# Patient Record
Sex: Male | Born: 1995 | Race: White | Hispanic: No | Marital: Single | Smoking: Never smoker
Health system: Southern US, Community
[De-identification: ages and names within clinical notes are randomized; demographics above are authoritative.]

---
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
## 2015-09-03 ENCOUNTER — Encounter (HOSPITAL_COMMUNITY): Payer: Self-pay | Admitting: Emergency Medicine

## 2015-09-03 ENCOUNTER — Emergency Department (HOSPITAL_COMMUNITY)
Admission: EM | Admit: 2015-09-03 | Discharge: 2015-09-03 | Disposition: A | Discharge status: Home / Self Care | Payer: BLUE CROSS/BLUE SHIELD | Attending: Emergency Medicine | Admitting: Emergency Medicine

## 2015-09-03 ENCOUNTER — Emergency Department (HOSPITAL_COMMUNITY): Payer: BLUE CROSS/BLUE SHIELD

## 2015-09-03 DIAGNOSIS — Y9367 Activity, basketball: Secondary | ICD-10-CM | POA: Diagnosis not present

## 2015-09-03 DIAGNOSIS — S93401A Sprain of unspecified ligament of right ankle, initial encounter: Secondary | ICD-10-CM | POA: Diagnosis not present

## 2015-09-03 DIAGNOSIS — Y929 Unspecified place or not applicable: Secondary | ICD-10-CM | POA: Diagnosis not present

## 2015-09-03 DIAGNOSIS — Y999 Unspecified external cause status: Secondary | ICD-10-CM | POA: Insufficient documentation

## 2015-09-03 DIAGNOSIS — S99911A Unspecified injury of right ankle, initial encounter: Secondary | ICD-10-CM | POA: Diagnosis present

## 2015-09-03 DIAGNOSIS — M25511 Pain in right shoulder: Secondary | ICD-10-CM | POA: Insufficient documentation

## 2015-09-03 DIAGNOSIS — W1839XA Other fall on same level, initial encounter: Secondary | ICD-10-CM | POA: Diagnosis not present

## 2015-09-03 MED ORDER — IBUPROFEN 800 MG PO TABS
800.0000 mg | ORAL_TABLET | Freq: Once | ORAL | Status: DC
  Filled 2015-09-03: qty 1

## 2015-09-03 NOTE — Discharge Instructions (Signed)
Ankle Sprain  An ankle sprain is an injury to the strong, fibrous tissues (ligaments) that hold the bones of your ankle joint together.   CAUSES  An ankle sprain is usually caused by a fall or by twisting your ankle. Ankle sprains most commonly occur when you step on the outer edge of your foot, and your ankle turns inward. People who participate in sports are more prone to these types of injuries.   SYMPTOMS    Pain in your ankle. The pain may be present at rest or only when you are trying to stand or walk.   Swelling.   Bruising. Bruising may develop immediately or within 1 to 2 days after your injury.   Difficulty standing or walking, particularly when turning corners or changing directions.  DIAGNOSIS   Your caregiver will ask you details about your injury and perform a physical exam of your ankle to determine if you have an ankle sprain. During the physical exam, your caregiver will press on and apply pressure to specific areas of your foot and ankle. Your caregiver will try to move your ankle in certain ways. An X-ray exam may be done to be sure a bone was not broken or a ligament did not separate from one of the bones in your ankle (avulsion fracture).   TREATMENT   Certain types of braces can help stabilize your ankle. Your caregiver can make a recommendation for this. Your caregiver may recommend the use of medicine for pain. If your sprain is severe, your caregiver may refer you to a surgeon who helps to restore function to parts of your skeletal system (orthopedist) or a physical therapist.  HOME CARE INSTRUCTIONS    Apply ice to your injury for 1-2 days or as directed by your caregiver. Applying ice helps to reduce inflammation and pain.    Put ice in a plastic bag.    Place a towel between your skin and the bag.    Leave the ice on for 15-20 minutes at a time, every 2 hours while you are awake.   Only take over-the-counter or prescription medicines for pain, discomfort, or fever as directed by  your caregiver.   Elevate your injured ankle above the level of your heart as much as possible for 2-3 days.   If your caregiver recommends crutches, use them as instructed. Gradually put weight on the affected ankle. Continue to use crutches or a cane until you can walk without feeling pain in your ankle.   If you have a plaster splint, wear the splint as directed by your caregiver. Do not rest it on anything harder than a pillow for the first 24 hours. Do not put weight on it. Do not get it wet. You may take it off to take a shower or bath.   You may have been given an elastic bandage to wear around your ankle to provide support. If the elastic bandage is too tight (you have numbness or tingling in your foot or your foot becomes cold and blue), adjust the bandage to make it comfortable.   If you have an air splint, you may blow more air into it or let air out to make it more comfortable. You may take your splint off at night and before taking a shower or bath. Wiggle your toes in the splint several times per day to decrease swelling.  SEEK MEDICAL CARE IF:    You have rapidly increasing bruising or swelling.   Your toes feel   extremely cold or you lose feeling in your foot.   Your pain is not relieved with medicine.  SEEK IMMEDIATE MEDICAL CARE IF:   Your toes are numb or blue.   You have severe pain that is increasing.  MAKE SURE YOU:    Understand these instructions.   Will watch your condition.   Will get help right away if you are not doing well or get worse.     This information is not intended to replace advice given to you by your health care provider. Make sure you discuss any questions you have with your health care provider.     Document Released: 06/02/2005 Document Revised: 06/23/2014 Document Reviewed: 06/14/2011  Elsevier Interactive Patient Education 2016 [employer]

## 2015-09-03 NOTE — ED Notes (Addendum)
Patient reports was playing basketball when he fell and twisted ankle. Complaining of right ankle swelling, but denies pain.

## 2015-09-03 NOTE — ED Provider Notes (Signed)
CSN: [id number]     Arrival date & time 09/03/15  1947 History   First MD Initiated Contact with Patient 09/03/15 2130     Chief Complaint  Patient presents with  . Ankle Injury     (Consider location/radiation/quality/duration/timing/severity/associated sxs/prior Treatment) Patient is a 20 y.o. male presenting with lower extremity injury. The history is provided by the patient.  Ankle Injury This is a new problem. The current episode started today. The problem occurs intermittently. The problem has been unchanged. Associated symptoms include arthralgias. Pertinent negatives include no abdominal pain, chest pain, coughing or neck pain. The symptoms are aggravated by standing. He has tried nothing for the symptoms. The treatment provided moderate relief.    History reviewed. No pertinent past medical history. History reviewed. No pertinent past surgical history. History reviewed. No pertinent family history. Social History  Substance Use Topics  . Smoking status: Never Smoker   . Smokeless tobacco: None  . Alcohol Use: None    Review of Systems  Constitutional: Negative for activity change.       All ROS Neg except as noted in HPI  HENT: Negative for nosebleeds.   Eyes: Negative for photophobia and discharge.  Respiratory: Negative for cough, shortness of breath and wheezing.   Cardiovascular: Negative for chest pain and palpitations.  Gastrointestinal: Negative for abdominal pain and blood in stool.  Genitourinary: Negative for dysuria, frequency and hematuria.  Musculoskeletal: Positive for arthralgias. Negative for back pain and neck pain.  Skin: Negative.   Neurological: Negative for dizziness, seizures and speech difficulty.  Psychiatric/Behavioral: Negative for hallucinations and confusion.      Allergies  Review of patient's allergies indicates no known allergies.  Home Medications   Prior to Admission medications   Not on File   BP 149/72 mmHg  Pulse 91   Temp(Src) 98.8 F (37.1 C) (Oral)  Resp 18  Ht 5\' 11"  (1.803 m)  Wt 72.576 kg  BMI 22.33 kg/m2  SpO2 100% Physical Exam  Constitutional: He is oriented to person, place, and time. He appears well-developed and well-nourished.  Non-toxic appearance.  HENT:  Head: Normocephalic.  Right Ear: Tympanic membrane and external ear normal.  Left Ear: Tympanic membrane and external ear normal.  Eyes: EOM and lids are normal. Pupils are equal, round, and reactive to light.  Neck: Normal range of motion. Neck supple. Carotid bruit is not present.  Cardiovascular: Normal rate, regular rhythm, normal heart sounds, intact distal pulses and normal pulses.   Pulmonary/Chest: Breath sounds normal. No respiratory distress.  Abdominal: Soft. Bowel sounds are normal. There is no tenderness. There is no guarding.  Musculoskeletal: Normal range of motion.       Right ankle: He exhibits swelling. Tenderness. Lateral malleolus tenderness found.  Lymphadenopathy:       Head (right side): No submandibular adenopathy present.       Head (left side): No submandibular adenopathy present.    He has no cervical adenopathy.  Neurological: He is alert and oriented to person, place, and time. He has normal strength. No cranial nerve deficit or sensory deficit.  Skin: Skin is warm and dry.  Psychiatric: He has a normal mood and affect. His speech is normal.  Nursing note and vitals reviewed.   ED Course  Procedures (including critical care time) Labs Review Labs Reviewed - No data to display  Imaging Review Dg Ankle Complete Right  09/03/2015  CLINICAL DATA:  Lateral right ankle pain and swelling after rolling injury during basketball. EXAM:  RIGHT ANKLE - COMPLETE 3+ VIEW COMPARISON:  None. FINDINGS: Lateral soft tissue swelling about the right ankle. No evidence of acute fracture or subluxation. No focal bone lesion or bone destruction. Bone cortex and trabecular architecture appear intact. No radiopaque soft  tissue foreign bodies. IMPRESSION: Lateral soft tissue swelling.  No acute fracture. Electronically Signed   By: Burman Nieves M.D.   On: 09/03/2015 20:18   I have personally reviewed and evaluated these images and lab results as part of my medical decision-making.   EKG Interpretation None      MDM X-ray is negative for fracture or dislocation. No neurovascular compromise appreciated on examination.  Pt given ice pack and fitted with ASO splint. patient to elevate the ankle is much as possible. He will use Tylenol and ibuprofen for soreness.    Final diagnoses:  Ankle sprain, right, initial encounter    *I have reviewed nursing notes, vital signs, and all appropriate lab and imaging results for this patient.[address], PA-C 09/03/15 2254  Glynn Octave, MD 09/04/15 [phone]

## 2016-06-06 ENCOUNTER — Emergency Department (HOSPITAL_COMMUNITY)
Admission: EM | Admit: 2016-06-06 | Discharge: 2016-06-06 | Disposition: A | Discharge status: Home / Self Care | Payer: BLUE CROSS/BLUE SHIELD | Attending: Emergency Medicine | Admitting: Emergency Medicine

## 2016-06-06 ENCOUNTER — Encounter (HOSPITAL_COMMUNITY): Payer: Self-pay | Admitting: *Deleted

## 2016-06-06 DIAGNOSIS — L245 Irritant contact dermatitis due to other chemical products: Secondary | ICD-10-CM | POA: Insufficient documentation

## 2016-06-06 DIAGNOSIS — L299 Pruritus, unspecified: Secondary | ICD-10-CM | POA: Diagnosis present

## 2016-06-06 DIAGNOSIS — Z791 Long term (current) use of non-steroidal anti-inflammatories (NSAID): Secondary | ICD-10-CM | POA: Diagnosis not present

## 2016-06-06 MED ORDER — TRIAMCINOLONE ACETONIDE 0.1 % EX CREA: 1.0000 "application " | TOPICAL_CREAM | Freq: Three times a day (TID) | CUTANEOUS | 0 refills | Status: DC

## 2016-06-06 MED ORDER — HYDROCORTISONE 1 % EX CREA
TOPICAL_CREAM | Freq: Once | CUTANEOUS | Status: AC
  Administered 2016-06-06: 1 via TOPICAL
  Filled 2016-06-06: qty 1.5

## 2016-06-06 MED ORDER — DIPHENHYDRAMINE HCL 25 MG PO CAPS
25.0000 mg | ORAL_CAPSULE | Freq: Once | ORAL | Status: AC
  Administered 2016-06-06: 25 mg via ORAL
  Filled 2016-06-06: qty 1

## 2016-06-06 NOTE — ED Provider Notes (Signed)
[facility] Provider Note   CSN: [id number] Arrival date & time: 06/06/16  2225     History   Chief Complaint Chief Complaint  Patient presents with  . Insect Bite    HPI Robert Savage is a 20 y.o. male.  HPI  Robert Savage is a 20 y.o. male who presents to the Emergency Department complaining of possible insect bite to his right wrist.  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  He states the redness has increased in size since onset.  He has taken ibuprofen without relief. He also complains of some swelling to the area.  He denies significant pain, numbness, drainage or difficulty moving the wrist or fingers.  Immunizations current   History reviewed. No pertinent past medical history.  There are no active problems to display for this patient.   History reviewed. No pertinent surgical history.     Home Medications    Prior to Admission medications   Not on File    Family History History reviewed. No pertinent family history.  Social History Social History  Substance Use Topics  . Smoking status: Never Smoker  . Smokeless tobacco: Never Used  . Alcohol use No     Allergies   Patient has no known allergies.   Review of Systems Review of Systems  Constitutional: Negative for activity change, appetite change, chills and fever.  HENT: Negative for facial swelling, sore throat and trouble swallowing.   Respiratory: Negative for chest tightness, shortness of breath and wheezing.   Musculoskeletal: Negative for neck pain and neck stiffness.  Skin: Positive for color change. Negative for rash and wound.       Insect bite right wrist  Neurological: Negative for dizziness, weakness, numbness and headaches.  All other systems reviewed and are negative.    Physical Exam Updated Vital Signs BP 140/83 (BP Location: Left Arm)   Pulse 83   Temp 97.7 F (36.5 C) (Oral)   Resp 18   Ht 5\' 9"  (1.753 m)    Wt 70.3 kg   SpO2 99%   BMI 22.89 kg/m   Physical Exam  Constitutional: He is oriented to person, place, and time. He appears well-developed and well-nourished. No distress.  HENT:  Head: Normocephalic.  Neck: Normal range of motion. Neck supple.  Cardiovascular: Normal rate, regular rhythm and intact distal pulses.   No murmur heard. Pulmonary/Chest: Effort normal and breath sounds normal. No respiratory distress.  Musculoskeletal: Normal range of motion. He exhibits no edema or tenderness.  Lymphadenopathy:    He has no cervical adenopathy.  Neurological: He is alert and oriented to person, place, and time. He exhibits normal muscle tone. Coordination normal.  Skin: Skin is warm. No rash noted. There is erythema.  Focal area of mild erythema with a central abrasion to proximal right wrist.  No edema, induration or drainage.    Nursing note and vitals reviewed.    ED Treatments / Results  Labs (all labs ordered are listed, but only abnormal results are displayed) Labs Reviewed - No data to display  EKG  EKG Interpretation None       Radiology No results found.  Procedures Procedures (including critical care time)  Medications Ordered in ED Medications  diphenhydrAMINE (BENADRYL) capsule 25 mg (not administered)  hydrocortisone cream 1 % (not administered)     Initial Impression / Assessment and Plan / ED Course  I have reviewed the triage  vital signs and the nursing notes.  Pertinent labs & imaging results that were available during my care of the patient were reviewed by me and considered in my medical decision making (see chart for details).  Clinical Course     Pt well appearing.  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 Focal area of erythema of the right wrist that appears c/w contact dermatitis likely related to chemical exposure.  Agrees to benadryl, steroid cream.  Return precautions given  Final Clinical Impressions(s) / ED Diagnoses   Final  diagnoses:  Irritant contact dermatitis due to other chemical products    New Prescriptions New Prescriptions   No medications on file     Pauline Ausammy Kaedyn Belardo, PA-C 06/06/16 2317    Mancel BaleElliott Wentz, MD 06/10/16 1254

## 2016-06-06 NOTE — Discharge Instructions (Signed)
Clean the area with mild soap and water.  Apply the cream as directed.  You can take OTC benadryl one capsule every 4-6 hrs as needed for itching.

## 2016-06-06 NOTE — ED Triage Notes (Signed)
Pt believes he was bitten by an insect while at work. Pt has a reddened area to his right wrist/forearm. Pt states he noticed at around 1 om today while at work. Pt has taken tylenol.

## 2020-02-24 ENCOUNTER — Other Ambulatory Visit: Payer: Self-pay

## 2020-02-24 ENCOUNTER — Ambulatory Visit
Admission: EM | Admit: 2020-02-24 | Discharge: 2020-02-24 | Disposition: A | Discharge status: Home / Self Care | Payer: BC Managed Care – PPO | Attending: Emergency Medicine | Admitting: Emergency Medicine

## 2020-02-24 ENCOUNTER — Encounter: Payer: Self-pay | Admitting: Emergency Medicine

## 2020-02-24 DIAGNOSIS — J069 Acute upper respiratory infection, unspecified: Secondary | ICD-10-CM

## 2020-02-24 DIAGNOSIS — Z1152 Encounter for screening for COVID-19: Secondary | ICD-10-CM | POA: Diagnosis not present

## 2020-02-24 MED ORDER — CETIRIZINE HCL 5 MG/5ML PO SOLN: 10.0000 mg | Freq: Every day | ORAL | 0 refills | Status: DC

## 2020-02-24 MED ORDER — FLUTICASONE PROPIONATE 50 MCG/ACT NA SUSP: 1.0000 | Freq: Every day | NASAL | 0 refills | Status: DC

## 2020-02-24 MED ORDER — BENZONATATE 100 MG PO CAPS: 100.0000 mg | ORAL_CAPSULE | Freq: Three times a day (TID) | ORAL | 0 refills | Status: DC

## 2020-02-24 MED ORDER — PREDNISONE 5 MG/5ML PO SOLN: 20.0000 mg | Freq: Every day | ORAL | 0 refills | Status: AC

## 2020-02-24 NOTE — ED Provider Notes (Signed)
[facility]   [id number] 02/24/20 Arrival Time: 0813   CC: COVID symptoms  SUBJECTIVE: History from: patient.  Robert Savage is a 24 y.o. male who presents to the urgent care for complaint of nasal congestion, sinus congestion and postnasal drip  for the past 2 days.  Denies sick exposure to COVID, flu or strep.  Denies recent travel.  Has tried OTC Zyrtec without relief.  Denies aggravating factors.  Denies previous symptoms in the past.   Denies fever, chills, fatigue, sinus pain, rhinorrhea, sore throat, SOB, wheezing, chest pain, nausea, changes in bowel or bladder habits.    ROS: As per HPI.  All other pertinent ROS negative.      History reviewed. No pertinent past medical history. History reviewed. No pertinent surgical history. No Known Allergies No current facility-administered medications on file prior to encounter.   Current Outpatient Medications on File Prior to Encounter  Medication Sig Dispense Refill  . ibuprofen (ADVIL,MOTRIN) 200 MG tablet Take 200 mg by mouth every 6 (six) hours as needed for mild pain or moderate pain.    Marland Kitchen triamcinolone cream (KENALOG) 0.1 % Apply 1 application topically 3 (three) times daily. To affected area 15 g 0   Social History   Socioeconomic History  . Marital status: Single    Spouse name: Not on file  . Number of children: Not on file  . Years of education: Not on file  . Highest education level: Not on file  Occupational History  . Not on file  Tobacco Use  . Smoking status: Never Smoker  . Smokeless tobacco: Never Used  Substance and Sexual Activity  . Alcohol use: No  . Drug use: No  . Sexual activity: Not on file  Other Topics Concern  . Not on file  Social History Narrative  . Not on file   Social Determinants of Health   Financial Resource Strain:   . Difficulty of Paying Living Expenses: Not on file  Food Insecurity:   . Worried About Programme researcher, broadcasting/film/video in the Last Year: Not on file  . Ran Out of  Food in the Last Year: Not on file  Transportation Needs:   . Lack of Transportation (Medical): Not on file  . Lack of Transportation (Non-Medical): Not on file  Physical Activity:   . Days of Exercise per Week: Not on file  . Minutes of Exercise per Session: Not on file  Stress:   . Feeling of Stress : Not on file  Social Connections:   . Frequency of Communication with Friends and Family: Not on file  . Frequency of Social Gatherings with Friends and Family: Not on file  . Attends Religious Services: Not on file  . Active Member of Clubs or Organizations: Not on file  . Attends Banker Meetings: Not on file  . Marital Status: Not on file  Intimate Partner Violence:   . Fear of Current or Ex-Partner: Not on file  . Emotionally Abused: Not on file  . Physically Abused: Not on file  . Sexually Abused: Not on file   No family history on file.  OBJECTIVE:  Vitals:   02/24/20 0824  BP: (!) 161/90  Pulse: 75  Resp: 16  Temp: 98.2 F (36.8 C)  TempSrc: Oral  SpO2: 99%     General appearance: alert; appears fatigued, but nontoxic; speaking in full sentences and tolerating own secretions HEENT: NCAT; Ears: EACs clear, TMs pearly gray; Eyes: PERRL.  EOM grossly  intact. Sinuses: nontender; Nose: nares patent without rhinorrhea, Throat: oropharynx clear, tonsils non erythematous or enlarged, uvula midline  Neck: supple without LAD Lungs: unlabored respirations, symmetrical air entry; cough: mild; no respiratory distress; CTAB Heart: regular rate and rhythm.  Radial pulses 2+ symmetrical bilaterally Skin: warm and dry Psychological: alert and cooperative; normal mood and affect  LABS:  No results found for this or any previous visit (from the past 24 hour(s)).   ASSESSMENT & PLAN:  1. Viral URI   2. Encounter for screening for COVID-19     Meds ordered this encounter  Medications  . benzonatate (TESSALON) 100 MG capsule    Sig: Take 1 capsule (100 mg total)  by mouth every 8 (eight) hours.    Dispense:  30 capsule    Refill:  0  . fluticasone (FLONASE) 50 MCG/ACT nasal spray    Sig: Place 1 spray into both nostrils daily for 14 days.    Dispense:  16 g    Refill:  0  . cetirizine HCl (ZYRTEC) 5 MG/5ML SOLN    Sig: Take 10 mLs (10 mg total) by mouth daily.    Dispense:  300 mL    Refill:  0  . predniSONE 5 MG/5ML solution    Sig: Take 20 mLs (20 mg total) by mouth daily with breakfast for 7 days.    Dispense:  140 mL    Refill:  0    Discharge Instructions    COVID testing ordered.  It will take between 2-7 days for test results.  Someone will contact you regarding abnormal results.    In the meantime: You should remain isolated in your home for 10 days from symptom onset AND greater than 24 hours after symptoms resolution (absence of fever without the use of fever-reducing medication and improvement in respiratory symptoms), whichever is longer Get plenty of rest and push fluids Tessalon Perles prescribed for cough Zyrtec for nasal congestion, runny nose, and/or sore throat Flonase for nasal congestion and runny nose Prednisone prescribed Use medications daily for symptom relief Use OTC medications like ibuprofen or tylenol as needed fever or pain Call or go to the ED if you have any new or worsening symptoms such as fever, worsening cough, shortness of breath, chest tightness, chest pain, turning blue, changes in mental status, etc...   Reviewed expectations re: course of current medical issues. Questions answered. Outlined signs and symptoms indicating need for more acute intervention. Patient verbalized understanding. After Visit Summary given.    Note: This document was prepared using Dragon voice recognition software and may include unintentional dictation errors.      Durward Parcel, FNP 02/24/20 [phone]

## 2020-02-24 NOTE — ED Triage Notes (Signed)
Patient has sinus congestion x 2 days. No fever. does not want test for COVID

## 2020-02-24 NOTE — Discharge Instructions (Addendum)
COVID testing ordered.  It will take between 2-7 days for test results.  Someone will contact you regarding abnormal results.    In the meantime: You should remain isolated in your home for 10 days from symptom onset AND greater than 24 hours after symptoms resolution (absence of fever without the use of fever-reducing medication and improvement in respiratory symptoms), whichever is longer Get plenty of rest and push fluids Tessalon Perles prescribed for cough Zyrtec for nasal congestion, runny nose, and/or sore throat Flonase for nasal congestion and runny nose Prednisone prescribed Use medications daily for symptom relief Use OTC medications like ibuprofen or tylenol as needed fever or pain Call or go to the ED if you have any new or worsening symptoms such as fever, worsening cough, shortness of breath, chest tightness, chest pain, turning blue, changes in mental status, etc..Marland Kitchen

## 2020-02-27 LAB — NOVEL CORONAVIRUS, NAA: SARS-CoV-2, NAA: NOT DETECTED

## 2020-12-23 ENCOUNTER — Ambulatory Visit (INDEPENDENT_AMBULATORY_CARE_PROVIDER_SITE_OTHER): Payer: BC Managed Care – PPO

## 2020-12-23 ENCOUNTER — Other Ambulatory Visit: Payer: Self-pay

## 2020-12-23 ENCOUNTER — Ambulatory Visit
Admission: EM | Admit: 2020-12-23 | Discharge: 2020-12-23 | Disposition: A | Discharge status: Home / Self Care | Payer: BC Managed Care – PPO | Attending: Urgent Care | Admitting: Urgent Care

## 2020-12-23 DIAGNOSIS — M79602 Pain in left arm: Secondary | ICD-10-CM | POA: Diagnosis not present

## 2020-12-23 DIAGNOSIS — W19XXXA Unspecified fall, initial encounter: Secondary | ICD-10-CM

## 2020-12-23 DIAGNOSIS — M25522 Pain in left elbow: Secondary | ICD-10-CM

## 2020-12-23 DIAGNOSIS — S52125A Nondisplaced fracture of head of left radius, initial encounter for closed fracture: Secondary | ICD-10-CM | POA: Diagnosis not present

## 2020-12-23 MED ORDER — NAPROXEN 500 MG PO TABS: 500.0000 mg | ORAL_TABLET | Freq: Two times a day (BID) | ORAL | 0 refills | Status: AC

## 2020-12-23 MED ORDER — HYDROCODONE-ACETAMINOPHEN 5-325 MG PO TABS: 1.0000 | ORAL_TABLET | Freq: Four times a day (QID) | ORAL | 0 refills | Status: AC | PRN

## 2020-12-23 NOTE — ED Provider Notes (Signed)
[facility]   MRN: [id number] DOB: [birth year]  Subjective:   Robert Savage is a 25 y.o. male presenting for suffering a left elbow injury yesterday.  Patient was playing basketball and ended up jumping, losing his balance and on the way down and landed on his left elbow.  Has had difficulty with bending and extending his elbow, pain from trying the same.  Has used Tylenol with minimal relief.  No current facility-administered medications for this encounter.  Current Outpatient Medications:    benzonatate (TESSALON) 100 MG capsule, Take 1 capsule (100 mg total) by mouth every 8 (eight) hours., Disp: 30 capsule, Rfl: 0   cetirizine HCl (ZYRTEC) 5 MG/5ML SOLN, Take 10 mLs (10 mg total) by mouth daily., Disp: 300 mL, Rfl: 0   fluticasone (FLONASE) 50 MCG/ACT nasal spray, Place 1 spray into both nostrils daily for 14 days., Disp: 16 g, Rfl: 0   ibuprofen (ADVIL,MOTRIN) 200 MG tablet, Take 200 mg by mouth every 6 (six) hours as needed for mild pain or moderate pain., Disp: , Rfl:    triamcinolone cream (KENALOG) 0.1 %, Apply 1 application topically 3 (three) times daily. To affected area, Disp: 15 g, Rfl: 0   No Known Allergies  History reviewed. No pertinent past medical history.   History reviewed. No pertinent surgical history.  History reviewed. No pertinent family history.  Social History   Tobacco Use   Smoking status: Never   Smokeless tobacco: Never  Vaping Use   Vaping Use: Never used  Substance Use Topics   Alcohol use: No   Drug use: No    ROS   Objective:   Vitals: BP (!) 164/93 (BP Location: Right Arm)   Pulse 81   Temp 98.4 F (36.9 C) (Oral)   Resp 16   SpO2 98%   Physical Exam Constitutional:      General: He is not in acute distress.    Appearance: Normal appearance. He is well-developed and normal weight. He is not ill-appearing, toxic-appearing or diaphoretic.  HENT:     Head: Normocephalic and atraumatic.     Right Ear: External  ear normal.     Left Ear: External ear normal.     Nose: Nose normal.     Mouth/Throat:     Pharynx: Oropharynx is clear.  Eyes:     General: No scleral icterus.       Right eye: No discharge.        Left eye: No discharge.     Extraocular Movements: Extraocular movements intact.     Pupils: Pupils are equal, round, and reactive to light.  Cardiovascular:     Rate and Rhythm: Normal rate.  Pulmonary:     Effort: Pulmonary effort is normal.  Musculoskeletal:     Left elbow: Swelling and effusion present. No deformity or lacerations. Decreased range of motion. Tenderness present in radial head, medial epicondyle and lateral epicondyle. No olecranon process tenderness.     Cervical back: Normal range of motion.  Neurological:     Mental Status: He is alert and oriented to person, place, and time.  Psychiatric:        Mood and Affect: Mood normal.        Behavior: Behavior normal.        Thought Content: Thought content normal.        Judgment: Judgment normal.    DG Elbow Complete Left  Result Date: 12/23/2020 CLINICAL DATA:  Fall onto left arm. EXAM: LEFT  ELBOW - COMPLETE 3+ VIEW COMPARISON:  None. FINDINGS: Examination demonstrates displaced anterior posterior fat pads. Subtle cortical irregularity over the mediolateral aspect of the radial head suggesting subtle fracture. Remainder of the exam is unremarkable. IMPRESSION: Suggestion of subtle radial head fracture with associated displaced fat pads. Electronically Signed   By: Elberta Fortis M.D.   On: 12/23/2020 15:15     Assessment and Plan :   PDMP not reviewed this encounter.  1. Closed nondisplaced fracture of head of left radius, initial encounter   2. Left elbow pain     Patient placed in a long-arm splint with the elbow bent at 90 degrees.  Shoulder sling applied as well.  Naproxen for pain control, hydrocodone for breakthrough pain.  Follow-up with Dr. Romeo Apple as soon as possible. Counseled patient on potential for  adverse effects with medications prescribed/recommended today, ER and return-to-clinic precautions discussed, patient verbalized understanding.    Wallis Bamberg, PA-C 12/23/20 1539

## 2020-12-23 NOTE — ED Triage Notes (Signed)
Patient presents to Urgent Care with complaints of left arm injury. Pt states he was playing basketball yesterday and fell landing on his left arm. He reports pain increases with ROM. Treating pain with Tylenol.   Denies numbness or tingling sensation.

## 2020-12-23 NOTE — Discharge Instructions (Addendum)
Please schedule naproxen twice daily with food for your severe pain.  If you still have pain despite taking naproxen regularly, this is breakthrough pain.  You can use hydrocodone, a narcotic pain medicine, once every 4-6 hours for this.  Once your pain is better controlled, switch back to just naproxen.   Please wear the splint at all times and follow-up with Dr. Romeo Apple as soon as possible for further recommendations on your radial head fracture of the left elbow.

## 2020-12-24 ENCOUNTER — Telehealth: Payer: Self-pay | Admitting: Orthopedic Surgery

## 2020-12-24 NOTE — Telephone Encounter (Signed)
Called back to patient; scheduled for first available; aware of appointment with Dr Romeo Apple 12/26/20 and also that we have on our wait list.

## 2020-12-26 ENCOUNTER — Encounter: Payer: Self-pay | Admitting: Orthopedic Surgery

## 2020-12-26 ENCOUNTER — Ambulatory Visit: Payer: BC Managed Care – PPO | Admitting: Orthopedic Surgery

## 2020-12-26 ENCOUNTER — Other Ambulatory Visit: Payer: Self-pay

## 2020-12-26 ENCOUNTER — Telehealth: Payer: Self-pay | Admitting: Orthopedic Surgery

## 2020-12-26 VITALS — BP 147/107 | HR 75 | Ht 71.0 in | Wt 144.2 lb

## 2020-12-26 DIAGNOSIS — S52125A Nondisplaced fracture of head of left radius, initial encounter for closed fracture: Secondary | ICD-10-CM

## 2020-12-26 DIAGNOSIS — S52124A Nondisplaced fracture of head of right radius, initial encounter for closed fracture: Secondary | ICD-10-CM

## 2020-12-26 NOTE — Patient Instructions (Addendum)
Out of work note 6 weeks  Wear sling until the arm starts feeling sore  Remove the sling 3 times a day and then the elbow 25 timesno

## 2020-12-26 NOTE — Telephone Encounter (Signed)
Called to advise him no need to wear sling at night, left message

## 2020-12-26 NOTE — Telephone Encounter (Signed)
Patient has question following today's visit. Asking if he needs to sleep in sling/brace at night?

## 2020-12-26 NOTE — Progress Notes (Signed)
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  Encounter Diagnosis  Name Primary?   Closed nondisplaced fracture of head of right radius, initial encounter Yes    B. DATA ANALYSED:   IMAGING: Interpretation of images: External images x4 left elbow nondisplaced radial head fracture intact elbow articulation  Orders: No  Outside records reviewed: Urgent care   C. MANAGEMENT   Nonoperative with sling active range of motion  No orders of the defined types were placed in this encounter.    BP (!) 147/107   Pulse 75   Ht 5\' 11"  (1.803 m)   Wt 144 lb 3.2 oz (65.4 kg)   BMI 20.11 kg/m    General appearance: Well-developed well-nourished no gross deformities  Cardiovascular normal pulse and perfusion normal color without edema  Neurologically no sensation loss or deficits or pathologic reflexes  Psychological: Awake alert and oriented x3 mood and affect normal  Skin no lacerations or ulcerations no nodularity no palpable masses, no erythema or nodularity  Musculoskeletal:   Left elbow tenderness over the radial head and medial epicondyle wrist and forearm nontender total arc of motion 50 degrees   ROS Denies numbness tingling shortness of breath chest pain or skin rash or lesion  No past  medical history on file.  No past surgical history on file.  No family history on file. Social History   Tobacco Use   Smoking status: Never   Smokeless tobacco: Never  Vaping Use   Vaping Use: Never used  Substance Use Topics   Alcohol use: No   Drug use: No    No Known Allergies  Current Meds  Medication Sig   ibuprofen (ADVIL,MOTRIN) 200 MG tablet Take 200 mg by mouth every 6 (six) hours as needed for mild pain or moderate pain.   naproxen (NAPROSYN) 500 MG tablet Take 1 tablet (500 mg total) by mouth 2 (two) times daily with a meal.        , MD  12/26/2020 9:46 AM

## 2021-01-09 ENCOUNTER — Ambulatory Visit: Payer: BC Managed Care – PPO | Admitting: Orthopedic Surgery

## 2021-01-10 ENCOUNTER — Ambulatory Visit: Payer: BC Managed Care – PPO

## 2021-01-10 ENCOUNTER — Other Ambulatory Visit: Payer: Self-pay

## 2021-01-10 ENCOUNTER — Ambulatory Visit (INDEPENDENT_AMBULATORY_CARE_PROVIDER_SITE_OTHER): Payer: BC Managed Care – PPO | Admitting: Orthopedic Surgery

## 2021-01-10 DIAGNOSIS — S52125D Nondisplaced fracture of head of left radius, subsequent encounter for closed fracture with routine healing: Secondary | ICD-10-CM

## 2021-01-10 DIAGNOSIS — S52124D Nondisplaced fracture of head of right radius, subsequent encounter for closed fracture with routine healing: Secondary | ICD-10-CM

## 2021-01-10 NOTE — Progress Notes (Signed)
Fracture care follow-up   Chief Complaint  Patient presents with   Elbow Injury    FX care/DOI 12/22/20/xrays   Encounter Diagnosis  Name Primary?   Closed nondisplaced fracture of head of right radius with routine healing, subsequent encounter Yes    Left radial head fracture patient has excellent range of motion including pronation supination  Mild tenderness over the radial head  X-ray shows fracture is healing in stable position  Recommend 3 more weeks of protected activities  3 weeks out of work  X-ray in 3 weeks  Continue home exercise program including pronation supination and flexion extension exercises

## 2021-01-10 NOTE — Patient Instructions (Signed)
3 weeks OOW

## 2021-01-31 ENCOUNTER — Ambulatory Visit: Payer: BC Managed Care – PPO | Admitting: Orthopedic Surgery

## 2021-02-05 DIAGNOSIS — S52126D Nondisplaced fracture of head of unspecified radius, subsequent encounter for closed fracture with routine healing: Secondary | ICD-10-CM | POA: Insufficient documentation

## 2021-02-07 ENCOUNTER — Encounter: Payer: Self-pay | Admitting: Orthopedic Surgery

## 2021-02-07 ENCOUNTER — Other Ambulatory Visit: Payer: Self-pay

## 2021-02-07 ENCOUNTER — Ambulatory Visit (INDEPENDENT_AMBULATORY_CARE_PROVIDER_SITE_OTHER): Payer: BC Managed Care – PPO | Admitting: Orthopedic Surgery

## 2021-02-07 ENCOUNTER — Ambulatory Visit: Payer: BC Managed Care – PPO

## 2021-02-07 ENCOUNTER — Other Ambulatory Visit: Payer: Self-pay | Admitting: Orthopedic Surgery

## 2021-02-07 VITALS — Ht 71.0 in | Wt 144.0 lb

## 2021-02-07 DIAGNOSIS — S52125D Nondisplaced fracture of head of left radius, subsequent encounter for closed fracture with routine healing: Secondary | ICD-10-CM

## 2021-02-07 DIAGNOSIS — S52124D Nondisplaced fracture of head of right radius, subsequent encounter for closed fracture with routine healing: Secondary | ICD-10-CM

## 2021-02-07 NOTE — Progress Notes (Signed)
Chief Complaint  Patient presents with   Elbow Injury      FX care/DOI 12/22/20/xrays        Encounter Diagnosis  Name Primary?   Closed nondisplaced fracture of head of right radius with routine healing, subsequent encounter Yes    DOING WELL NO C/O PAIN   XRAYS: FRX HEALED   ROM NORMAL NO TENDERNESS  REL TO RTW 8/29

## 2021-02-07 NOTE — Patient Instructions (Signed)
Monday AUG 29 RTW FULL DUTY

## 2022-08-14 ENCOUNTER — Encounter: Payer: Self-pay | Admitting: Radiology

## 2023-07-01 IMAGING — DX DG ELBOW COMPLETE 3+V*L*
4 series · 4 of 4 positions shown · non-contrast
Comparison: None.

CLINICAL DATA: Fall onto left arm.

EXAM:
LEFT ELBOW - COMPLETE 3+ VIEW

[elbow ap]
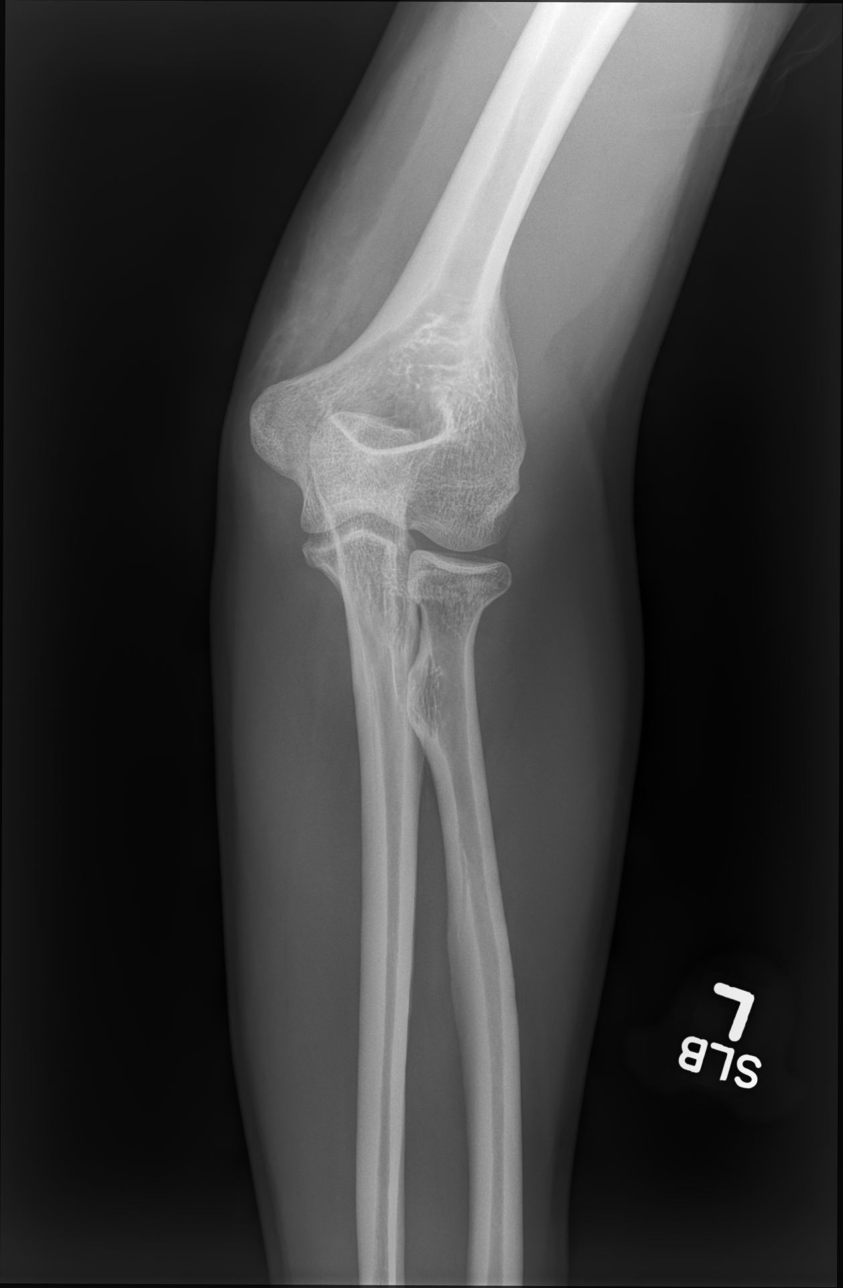

[elbow lmo (1 of 2)]
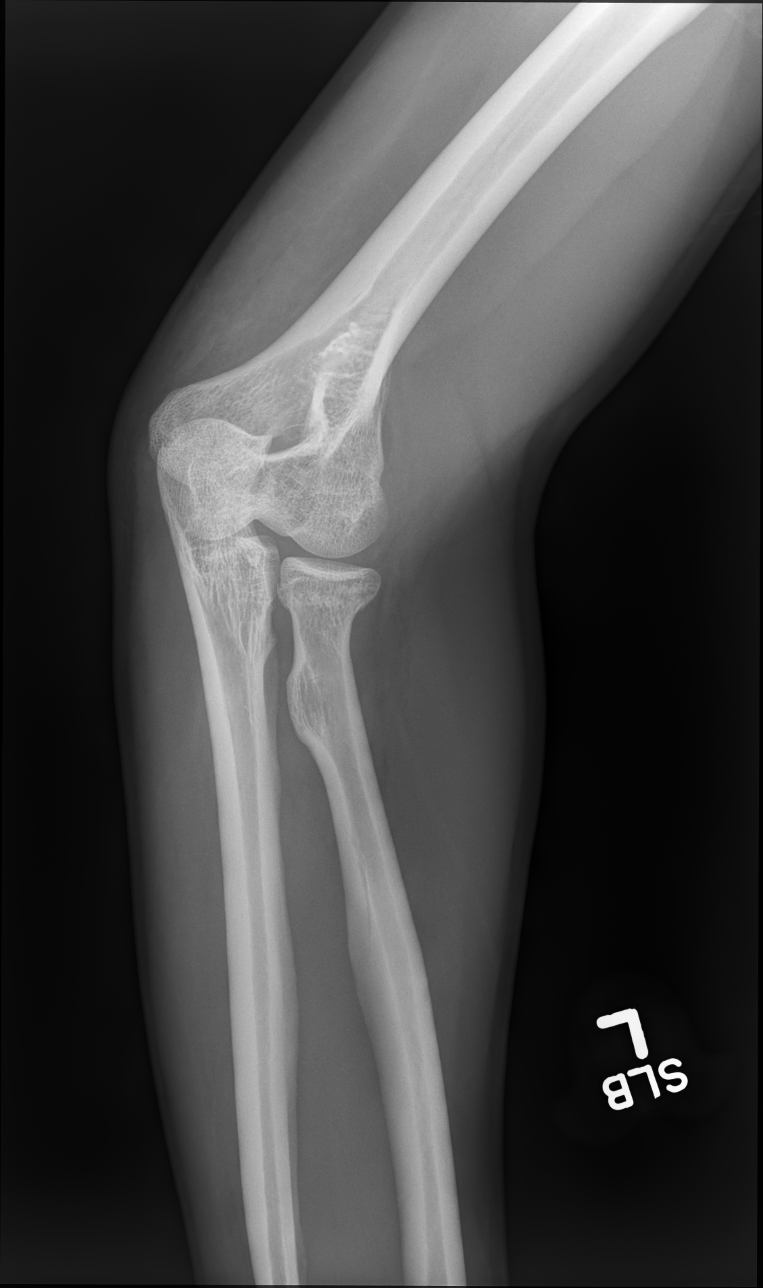

[elbow lmo (2 of 2)]
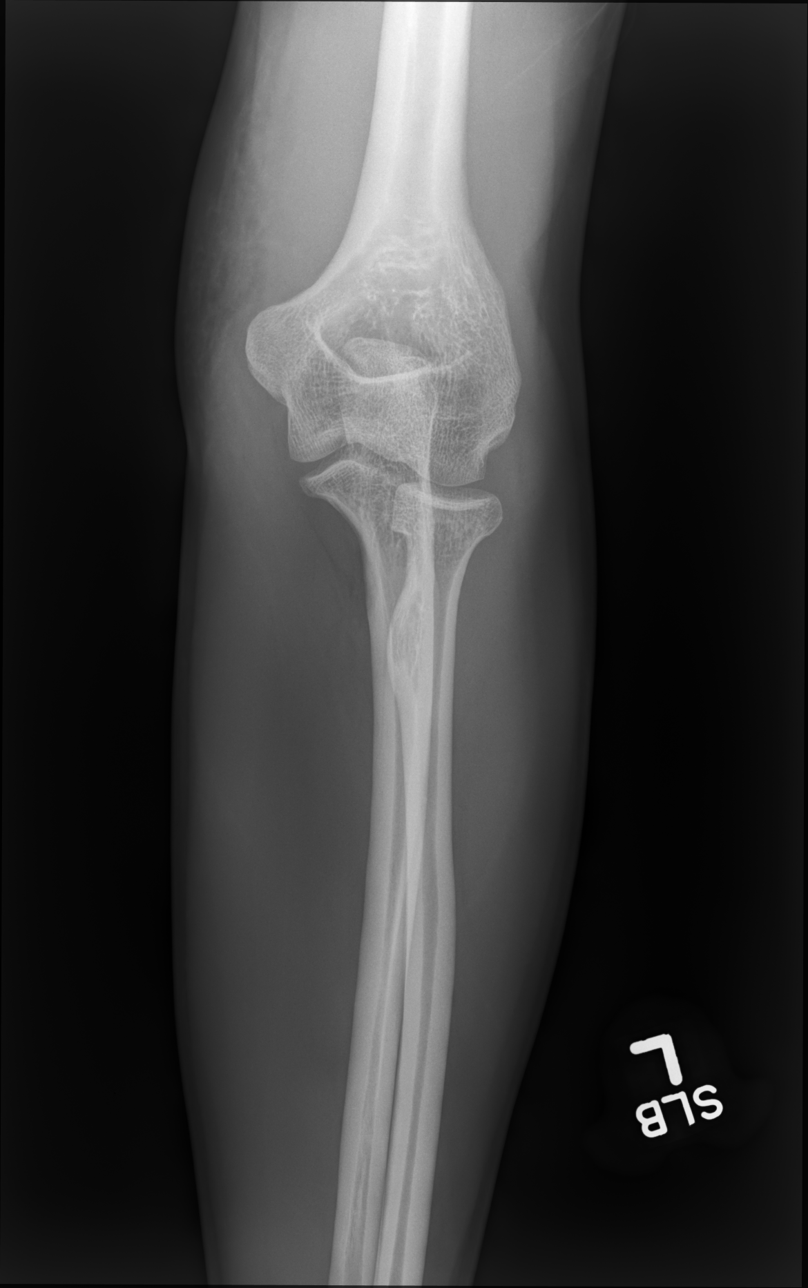

[elbow lat]
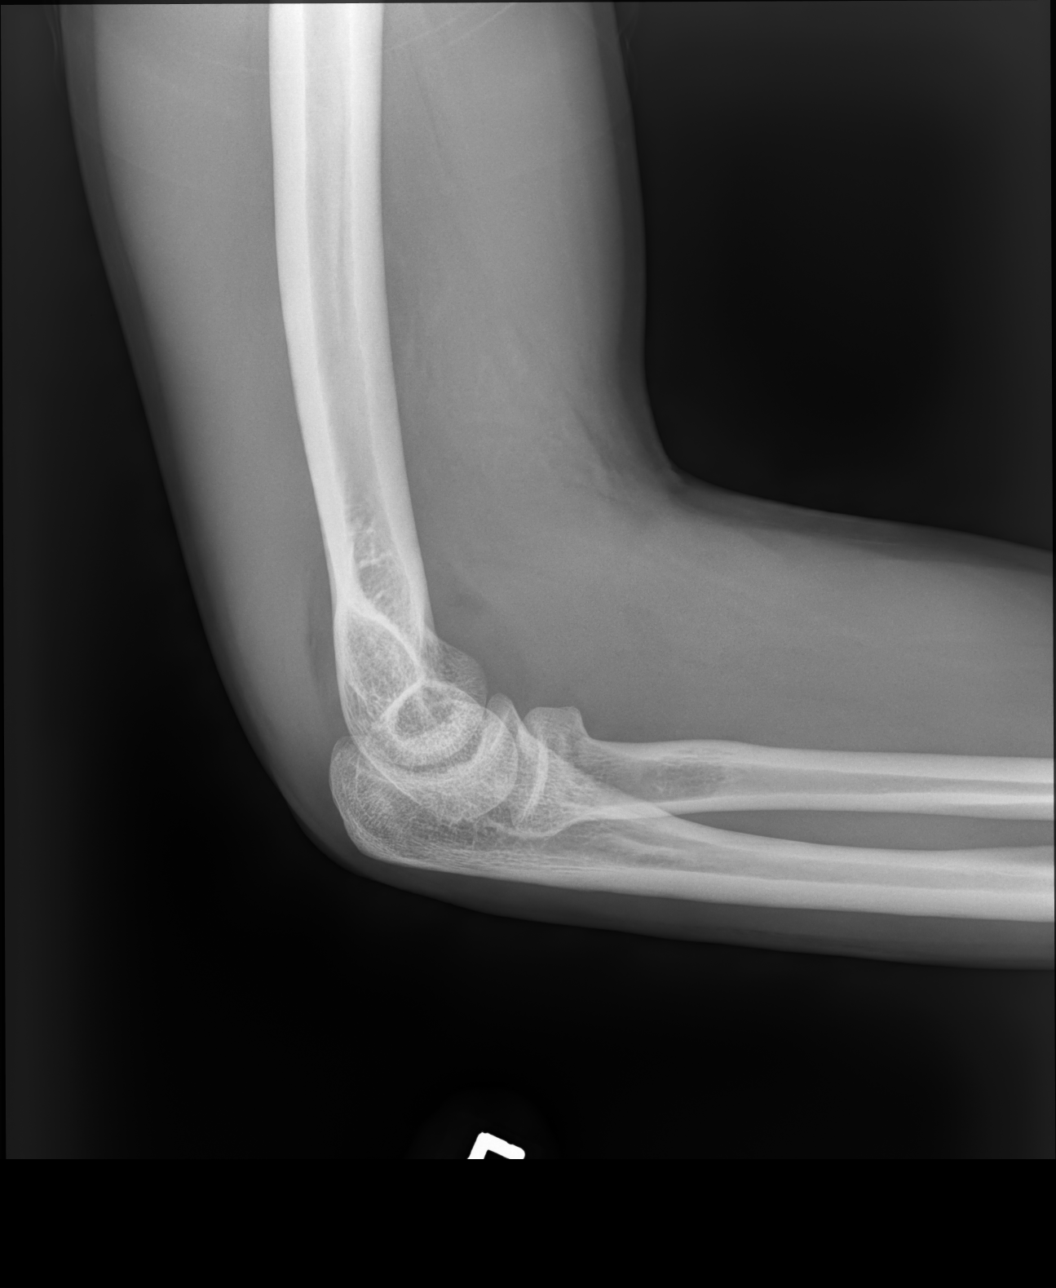

[4 of 4 positions shown; findings below may reference images not displayed]

FINDINGS: Examination demonstrates displaced anterior posterior fat pads.
Subtle cortical irregularity over the mediolateral aspect of the
radial head suggesting subtle fracture. Remainder of the exam is
unremarkable.
IMPRESSION: Suggestion of subtle radial head fracture with associated displaced
fat pads.
